# Patient Record
Sex: Female | Born: 1935 | Race: White | Hispanic: No | Marital: Married | Smoking: Former smoker
Health system: Southern US, Community
[De-identification: ages and names within clinical notes are randomized; demographics above are authoritative.]

## PROBLEM LIST (undated history)

## (undated) DIAGNOSIS — A310 Pulmonary mycobacterial infection: Secondary | ICD-10-CM

## (undated) DIAGNOSIS — J449 Chronic obstructive pulmonary disease, unspecified: Secondary | ICD-10-CM

## (undated) DIAGNOSIS — J181 Lobar pneumonia, unspecified organism: Secondary | ICD-10-CM

## (undated) DIAGNOSIS — J9621 Acute and chronic respiratory failure with hypoxia: Secondary | ICD-10-CM

---

## 2017-04-14 ENCOUNTER — Inpatient Hospital Stay
Admission: RE | Admit: 2017-04-14 | Discharge: 2017-06-09 | Disposition: E | Payer: Medicare PPO | Source: Other Acute Inpatient Hospital | Attending: Internal Medicine | Admitting: Internal Medicine

## 2017-04-14 ENCOUNTER — Ambulatory Visit (HOSPITAL_COMMUNITY)
Admission: AD | Admit: 2017-04-14 | Discharge: 2017-04-14 | Disposition: A | Discharge status: Home / Self Care | Payer: Medicare PPO | Source: Other Acute Inpatient Hospital | Attending: Internal Medicine | Admitting: Internal Medicine

## 2017-04-14 ENCOUNTER — Other Ambulatory Visit (HOSPITAL_COMMUNITY): Payer: Self-pay

## 2017-04-14 DIAGNOSIS — Z0189 Encounter for other specified special examinations: Secondary | ICD-10-CM

## 2017-04-14 DIAGNOSIS — J939 Pneumothorax, unspecified: Secondary | ICD-10-CM | POA: Diagnosis not present

## 2017-04-14 DIAGNOSIS — Z931 Gastrostomy status: Secondary | ICD-10-CM

## 2017-04-14 DIAGNOSIS — J9621 Acute and chronic respiratory failure with hypoxia: Secondary | ICD-10-CM | POA: Diagnosis present

## 2017-04-14 DIAGNOSIS — R079 Chest pain, unspecified: Secondary | ICD-10-CM

## 2017-04-14 DIAGNOSIS — R911 Solitary pulmonary nodule: Secondary | ICD-10-CM | POA: Diagnosis not present

## 2017-04-14 DIAGNOSIS — Z0489 Encounter for examination and observation for other specified reasons: Secondary | ICD-10-CM | POA: Diagnosis present

## 2017-04-14 DIAGNOSIS — J181 Lobar pneumonia, unspecified organism: Secondary | ICD-10-CM | POA: Diagnosis present

## 2017-04-14 DIAGNOSIS — A310 Pulmonary mycobacterial infection: Secondary | ICD-10-CM | POA: Diagnosis present

## 2017-04-14 DIAGNOSIS — T85598A Other mechanical complication of other gastrointestinal prosthetic devices, implants and grafts, initial encounter: Secondary | ICD-10-CM

## 2017-04-14 DIAGNOSIS — J449 Chronic obstructive pulmonary disease, unspecified: Secondary | ICD-10-CM | POA: Diagnosis present

## 2017-04-14 DIAGNOSIS — K668 Other specified disorders of peritoneum: Secondary | ICD-10-CM

## 2017-04-14 DIAGNOSIS — J9690 Respiratory failure, unspecified, unspecified whether with hypoxia or hypercapnia: Secondary | ICD-10-CM

## 2017-04-14 HISTORY — DX: Chronic obstructive pulmonary disease, unspecified: J44.9

## 2017-04-14 HISTORY — DX: Lobar pneumonia, unspecified organism: J18.1

## 2017-04-14 HISTORY — DX: Pulmonary mycobacterial infection: A31.0

## 2017-04-14 HISTORY — DX: Acute and chronic respiratory failure with hypoxia: J96.21

## 2017-04-15 LAB — URINALYSIS, ROUTINE W REFLEX MICROSCOPIC
Bacteria, UA: NONE SEEN
Bilirubin Urine: NEGATIVE
GLUCOSE, UA: NEGATIVE mg/dL
HGB URINE DIPSTICK: NEGATIVE
Ketones, ur: NEGATIVE mg/dL
LEUKOCYTES UA: NEGATIVE
NITRITE: NEGATIVE
PROTEIN: 30 mg/dL — AB
SPECIFIC GRAVITY, URINE: 1.024 (ref 1.005–1.030)
pH: 9 — ABNORMAL HIGH (ref 5.0–8.0)

## 2017-04-15 LAB — COMPREHENSIVE METABOLIC PANEL
ALBUMIN: 2.2 g/dL — AB (ref 3.5–5.0)
ALK PHOS: 41 U/L (ref 38–126)
ALT: 19 U/L (ref 14–54)
AST: 29 U/L (ref 15–41)
Anion gap: 14 (ref 5–15)
BILIRUBIN TOTAL: 0.6 mg/dL (ref 0.3–1.2)
BUN: 9 mg/dL (ref 6–20)
CALCIUM: 8.7 mg/dL — AB (ref 8.9–10.3)
CO2: 40 mmol/L — ABNORMAL HIGH (ref 22–32)
CREATININE: 0.89 mg/dL (ref 0.44–1.00)
Chloride: 86 mmol/L — ABNORMAL LOW (ref 101–111)
GFR, EST NON AFRICAN AMERICAN: 59 mL/min — AB (ref >60)
Glucose, Bld: 79 mg/dL (ref 65–99)
Potassium: 3.7 mmol/L (ref 3.5–5.1)
Sodium: 140 mmol/L (ref 135–145)
TOTAL PROTEIN: 6 g/dL — AB (ref 6.5–8.1)

## 2017-04-15 LAB — CBC
HCT: 35.2 % — ABNORMAL LOW (ref 36.0–46.0)
Hemoglobin: 10.4 g/dL — ABNORMAL LOW (ref 12.0–15.0)
MCH: 26.3 pg (ref 26.0–34.0)
MCHC: 29.5 g/dL — ABNORMAL LOW (ref 30.0–36.0)
MCV: 89.1 fL (ref 78.0–100.0)
PLATELETS: 119 10*3/uL — AB (ref 150–400)
RBC: 3.95 MIL/uL (ref 3.87–5.11)
RDW: 19 % — AB (ref 11.5–15.5)
WBC: 8 10*3/uL (ref 4.0–10.5)

## 2017-04-16 LAB — URINE CULTURE
Culture: <10000 — AB
Special Requests: NORMAL

## 2017-04-18 ENCOUNTER — Other Ambulatory Visit (HOSPITAL_COMMUNITY): Payer: Self-pay

## 2017-04-21 ENCOUNTER — Other Ambulatory Visit (HOSPITAL_COMMUNITY): Payer: Self-pay

## 2017-04-21 LAB — CBC
HEMATOCRIT: 37.7 % (ref 36.0–46.0)
HEMOGLOBIN: 10.5 g/dL — AB (ref 12.0–15.0)
MCH: 26.8 pg (ref 26.0–34.0)
MCHC: 27.9 g/dL — AB (ref 30.0–36.0)
MCV: 96.2 fL (ref 78.0–100.0)
Platelets: 137 10*3/uL — ABNORMAL LOW (ref 150–400)
RBC: 3.92 MIL/uL (ref 3.87–5.11)
RDW: 17.4 % — AB (ref 11.5–15.5)
WBC: 8.2 10*3/uL (ref 4.0–10.5)

## 2017-04-21 LAB — BASIC METABOLIC PANEL
Anion gap: 15 (ref 5–15)
BUN: 7 mg/dL (ref 6–20)
CHLORIDE: 81 mmol/L — AB (ref 101–111)
CO2: 41 mmol/L — AB (ref 22–32)
CREATININE: 0.56 mg/dL (ref 0.44–1.00)
Calcium: 9.2 mg/dL (ref 8.9–10.3)
GFR calc Af Amer: >60 mL/min (ref >60)
GFR calc non Af Amer: >60 mL/min (ref >60)
Glucose, Bld: 156 mg/dL — ABNORMAL HIGH (ref 65–99)
Potassium: 4 mmol/L (ref 3.5–5.1)
Sodium: 137 mmol/L (ref 135–145)

## 2017-04-21 LAB — BLOOD GAS, ARTERIAL
ACID-BASE EXCESS: 20 mmol/L — AB (ref 0.0–2.0)
Acid-Base Excess: 24 mmol/L — ABNORMAL HIGH (ref 0.0–2.0)
Bicarbonate: 48.9 mmol/L — ABNORMAL HIGH (ref 20.0–28.0)
Bicarbonate: 50.9 mmol/L — ABNORMAL HIGH (ref 20.0–28.0)
FIO2: 70
FIO2: 70
MECHVT: 300 mL
O2 SAT: 93 %
O2 Saturation: 92.3 %
PATIENT TEMPERATURE: 98.6
PCO2 ART: 93.2 mmHg — AB (ref 32.0–48.0)
PEEP/CPAP: 5 cmH2O
PEEP: 5 cmH2O
PH ART: 7.171 — AB (ref 7.350–7.450)
PO2 ART: 75.9 mmHg — AB (ref 83.0–108.0)
PO2 ART: 59.5 mmHg — AB (ref 83.0–108.0)
Patient temperature: 98.6
RATE: 18 resp/min
RATE: 18 resp/min
VT: 300 mL
pH, Arterial: 7.357 (ref 7.350–7.450)

## 2017-04-21 LAB — D-DIMER, QUANTITATIVE: D-Dimer, Quant: 3.13 ug/mL-FEU — ABNORMAL HIGH (ref 0.00–0.50)

## 2017-04-21 LAB — TROPONIN I: Troponin I: <0.03 ng/mL (ref <0.03)

## 2017-04-21 MED FILL — Medication: Qty: 1 | Status: AC

## 2017-04-22 LAB — BLOOD GAS, ARTERIAL
Acid-Base Excess: 26.1 mmol/L — ABNORMAL HIGH (ref 0.0–2.0)
Bicarbonate: 52.7 mmol/L — ABNORMAL HIGH (ref 20.0–28.0)
FIO2: 70
O2 SAT: 99.1 %
PEEP: 5 cmH2O
Patient temperature: 98.9
RATE: 18 resp/min
VT: 300 mL
pCO2 arterial: 82.1 mmHg (ref 32.0–48.0)
pH, Arterial: 7.424 (ref 7.350–7.450)
pO2, Arterial: 157 mmHg — ABNORMAL HIGH (ref 83.0–108.0)

## 2017-04-22 LAB — BASIC METABOLIC PANEL
Anion gap: 15 (ref 5–15)
BUN: 10 mg/dL (ref 6–20)
CHLORIDE: 81 mmol/L — AB (ref 101–111)
CO2: 44 mmol/L — AB (ref 22–32)
CREATININE: 0.72 mg/dL (ref 0.44–1.00)
Calcium: 9.1 mg/dL (ref 8.9–10.3)
GFR calc Af Amer: >60 mL/min (ref >60)
GFR calc non Af Amer: >60 mL/min (ref >60)
Glucose, Bld: 131 mg/dL — ABNORMAL HIGH (ref 65–99)
POTASSIUM: 4.4 mmol/L (ref 3.5–5.1)
Sodium: 140 mmol/L (ref 135–145)

## 2017-04-22 LAB — MAGNESIUM: Magnesium: 1.6 mg/dL — ABNORMAL LOW (ref 1.7–2.4)

## 2017-04-23 LAB — BLOOD GAS, ARTERIAL
ACID-BASE EXCESS: 28.5 mmol/L — AB (ref 0.0–2.0)
Acid-Base Excess: 28.5 mmol/L — ABNORMAL HIGH (ref 0.0–2.0)
BICARBONATE: 54.8 mmol/L — AB (ref 20.0–28.0)
Bicarbonate: 55.4 mmol/L — ABNORMAL HIGH (ref 20.0–28.0)
FIO2: 0.45
FIO2: 50
MECHVT: 300 mL
O2 SAT: 94.9 %
O2 SAT: 95.1 %
PATIENT TEMPERATURE: 99.4
PCO2 ART: 89.4 mmHg — AB (ref 32.0–48.0)
PEEP/CPAP: 5 cmH2O
PEEP: 5 cmH2O
PH ART: 7.5 — AB (ref 7.350–7.450)
PO2 ART: 78.9 mmHg — AB (ref 83.0–108.0)
Patient temperature: 98.5
RATE: 18 resp/min
RATE: 18 resp/min
VT: 350 mL
pCO2 arterial: 70.8 mmHg (ref 32.0–48.0)
pH, Arterial: 7.411 (ref 7.350–7.450)
pO2, Arterial: 71 mmHg — ABNORMAL LOW (ref 83.0–108.0)

## 2017-04-23 LAB — BASIC METABOLIC PANEL
Anion gap: 13 (ref 5–15)
BUN: 14 mg/dL (ref 6–20)
CHLORIDE: 78 mmol/L — AB (ref 101–111)
CO2: 47 mmol/L — AB (ref 22–32)
CREATININE: 0.69 mg/dL (ref 0.44–1.00)
Calcium: 9.1 mg/dL (ref 8.9–10.3)
GFR calc Af Amer: >60 mL/min (ref >60)
GFR calc non Af Amer: >60 mL/min (ref >60)
Glucose, Bld: 112 mg/dL — ABNORMAL HIGH (ref 65–99)
POTASSIUM: 3.5 mmol/L (ref 3.5–5.1)
SODIUM: 138 mmol/L (ref 135–145)

## 2017-04-23 LAB — CBC
HEMATOCRIT: 31.1 % — AB (ref 36.0–46.0)
HEMOGLOBIN: 9 g/dL — AB (ref 12.0–15.0)
MCH: 27.1 pg (ref 26.0–34.0)
MCHC: 28.9 g/dL — AB (ref 30.0–36.0)
MCV: 93.7 fL (ref 78.0–100.0)
Platelets: 189 10*3/uL (ref 150–400)
RBC: 3.32 MIL/uL — AB (ref 3.87–5.11)
RDW: 19.3 % — ABNORMAL HIGH (ref 11.5–15.5)
WBC: 7.6 10*3/uL (ref 4.0–10.5)

## 2017-04-23 LAB — URINALYSIS, ROUTINE W REFLEX MICROSCOPIC
BILIRUBIN URINE: NEGATIVE
Glucose, UA: NEGATIVE mg/dL
Ketones, ur: NEGATIVE mg/dL
NITRITE: NEGATIVE
PH: 7 (ref 5.0–8.0)
Protein, ur: 100 mg/dL — AB
SPECIFIC GRAVITY, URINE: 1.027 (ref 1.005–1.030)
Squamous Epithelial / LPF: NONE SEEN

## 2017-04-24 LAB — URINE CULTURE

## 2017-04-27 LAB — CBC
HCT: 34.3 % — ABNORMAL LOW (ref 36.0–46.0)
Hemoglobin: 10.5 g/dL — ABNORMAL LOW (ref 12.0–15.0)
MCH: 27.6 pg (ref 26.0–34.0)
MCHC: 30.6 g/dL (ref 30.0–36.0)
MCV: 90.3 fL (ref 78.0–100.0)
PLATELETS: 202 10*3/uL (ref 150–400)
RBC: 3.8 MIL/uL — AB (ref 3.87–5.11)
RDW: 18.8 % — ABNORMAL HIGH (ref 11.5–15.5)
WBC: 15 10*3/uL — AB (ref 4.0–10.5)

## 2017-04-28 LAB — C DIFFICILE QUICK SCREEN W PCR REFLEX
C DIFFICILE (CDIFF) TOXIN: NEGATIVE
C DIFFICLE (CDIFF) ANTIGEN: NEGATIVE
C Diff interpretation: NOT DETECTED

## 2017-04-30 LAB — CBC
HEMATOCRIT: 28.8 % — AB (ref 36.0–46.0)
HEMOGLOBIN: 8.5 g/dL — AB (ref 12.0–15.0)
MCH: 27.5 pg (ref 26.0–34.0)
MCHC: 29.5 g/dL — ABNORMAL LOW (ref 30.0–36.0)
MCV: 93.2 fL (ref 78.0–100.0)
Platelets: 257 10*3/uL (ref 150–400)
RBC: 3.09 MIL/uL — ABNORMAL LOW (ref 3.87–5.11)
RDW: 19.5 % — ABNORMAL HIGH (ref 11.5–15.5)
WBC: 12.8 10*3/uL — ABNORMAL HIGH (ref 4.0–10.5)

## 2017-04-30 LAB — BASIC METABOLIC PANEL
ANION GAP: 15 (ref 5–15)
BUN: 18 mg/dL (ref 6–20)
CO2: 43 mmol/L — AB (ref 22–32)
Calcium: 8.6 mg/dL — ABNORMAL LOW (ref 8.9–10.3)
Chloride: 80 mmol/L — ABNORMAL LOW (ref 101–111)
Creatinine, Ser: 0.77 mg/dL (ref 0.44–1.00)
GFR calc non Af Amer: >60 mL/min (ref >60)
Glucose, Bld: 126 mg/dL — ABNORMAL HIGH (ref 65–99)
POTASSIUM: 2.9 mmol/L — AB (ref 3.5–5.1)
Sodium: 138 mmol/L (ref 135–145)

## 2017-05-01 LAB — POTASSIUM: Potassium: 3.2 mmol/L — ABNORMAL LOW (ref 3.5–5.1)

## 2017-05-02 ENCOUNTER — Encounter: Admission: RE | Disposition: E | Payer: Self-pay | Source: Other Acute Inpatient Hospital | Attending: Internal Medicine

## 2017-05-02 ENCOUNTER — Encounter (HOSPITAL_COMMUNITY): Payer: Self-pay | Admitting: Certified Registered"

## 2017-05-02 HISTORY — PX: TRACHEOSTOMY TUBE PLACEMENT: SHX814

## 2017-05-02 LAB — STOOL CULTURE REFLEX - RSASHR

## 2017-05-02 LAB — STOOL CULTURE REFLEX - CMPCXR

## 2017-05-02 LAB — STOOL CULTURE: E COLI SHIGA TOXIN ASSAY: NEGATIVE

## 2017-05-02 SURGERY — CREATION, TRACHEOSTOMY
Anesthesia: General | Site: Neck

## 2017-05-02 MED ORDER — PHENYLEPHRINE 40 MCG/ML (10ML) SYRINGE FOR IV PUSH (FOR BLOOD PRESSURE SUPPORT)
PREFILLED_SYRINGE | INTRAVENOUS | Status: DC | PRN
  Administered 2017-05-02 (×3): 80 ug via INTRAVENOUS

## 2017-05-02 MED ORDER — PROPOFOL 10 MG/ML IV BOLUS
INTRAVENOUS | Status: DC | PRN
  Administered 2017-05-02: 50 mg via INTRAVENOUS

## 2017-05-02 MED ORDER — EPHEDRINE SULFATE-NACL 50-0.9 MG/10ML-% IV SOSY
PREFILLED_SYRINGE | INTRAVENOUS | Status: DC | PRN
  Administered 2017-05-02: 10 mg via INTRAVENOUS
  Administered 2017-05-02: 15 mg via INTRAVENOUS

## 2017-05-02 MED ORDER — ROCURONIUM BROMIDE 10 MG/ML (PF) SYRINGE
PREFILLED_SYRINGE | INTRAVENOUS | Status: AC
  Filled 2017-05-02: qty 5

## 2017-05-02 MED ORDER — ROCURONIUM BROMIDE 10 MG/ML (PF) SYRINGE
PREFILLED_SYRINGE | INTRAVENOUS | Status: DC | PRN
  Administered 2017-05-02: 40 mg via INTRAVENOUS
  Administered 2017-05-02: 10 mg via INTRAVENOUS

## 2017-05-02 MED ORDER — DEXAMETHASONE SODIUM PHOSPHATE 10 MG/ML IJ SOLN
INTRAMUSCULAR | Status: AC
  Filled 2017-05-02: qty 1

## 2017-05-02 MED ORDER — ONDANSETRON HCL 4 MG/2ML IJ SOLN
INTRAMUSCULAR | Status: AC
  Filled 2017-05-02: qty 2

## 2017-05-02 MED ORDER — LIDOCAINE 2% (20 MG/ML) 5 ML SYRINGE
INTRAMUSCULAR | Status: DC | PRN
  Administered 2017-05-02: 40 mg via INTRAVENOUS

## 2017-05-02 MED ORDER — LIDOCAINE 2% (20 MG/ML) 5 ML SYRINGE
INTRAMUSCULAR | Status: AC
  Filled 2017-05-02: qty 5

## 2017-05-02 MED ORDER — PROPOFOL 10 MG/ML IV BOLUS
INTRAVENOUS | Status: AC
  Filled 2017-05-02: qty 20

## 2017-05-02 MED ORDER — LACTATED RINGERS IV SOLN
INTRAVENOUS | Status: DC | PRN
  Administered 2017-05-02: 08:00:00 via INTRAVENOUS

## 2017-05-02 MED ORDER — DEXAMETHASONE SODIUM PHOSPHATE 10 MG/ML IJ SOLN
INTRAMUSCULAR | Status: DC | PRN
  Administered 2017-05-02: 5 mg via INTRAVENOUS

## 2017-05-02 MED ORDER — ONDANSETRON HCL 4 MG/2ML IJ SOLN
INTRAMUSCULAR | Status: DC | PRN
Start: 2017-05-02 — End: 2017-05-02
  Administered 2017-05-02: 4 mg via INTRAVENOUS

## 2017-05-02 MED ORDER — LIDOCAINE-EPINEPHRINE 1 %-1:100000 IJ SOLN
INTRAMUSCULAR | Status: DC | PRN
  Administered 2017-05-02: 6 mL

## 2017-05-02 MED ORDER — FENTANYL CITRATE (PF) 100 MCG/2ML IJ SOLN
INTRAMUSCULAR | Status: DC | PRN
  Administered 2017-05-02: 50 ug via INTRAVENOUS

## 2017-05-02 MED ORDER — 0.9 % SODIUM CHLORIDE (POUR BTL) OPTIME
TOPICAL | Status: DC | PRN
  Administered 2017-05-02: 1000 mL

## 2017-05-02 MED ORDER — STERILE WATER FOR IRRIGATION IR SOLN
Status: DC | PRN
  Administered 2017-05-02: 1

## 2017-05-02 MED ORDER — FENTANYL CITRATE (PF) 250 MCG/5ML IJ SOLN
INTRAMUSCULAR | Status: AC
  Filled 2017-05-02: qty 5

## 2017-05-02 SURGICAL SUPPLY — 45 items
ATTRACTOMAT 16X20 MAGNETIC DRP (DRAPES) IMPLANT
BLADE SURG 15 STRL LF DISP TIS (BLADE) ×1 IMPLANT
BLADE SURG 15 STRL SS (BLADE) ×2
CLEANER TIP ELECTROSURG 2X2 (MISCELLANEOUS) ×3 IMPLANT
COVER SURGICAL LIGHT HANDLE (MISCELLANEOUS) ×3 IMPLANT
DRAPE HALF SHEET 40X57 (DRAPES) ×3 IMPLANT
ELECT COATED BLADE 2.86 ST (ELECTRODE) ×3 IMPLANT
ELECT REM PT RETURN 9FT ADLT (ELECTROSURGICAL) ×3
ELECTRODE REM PT RTRN 9FT ADLT (ELECTROSURGICAL) ×1 IMPLANT
GAUZE SPONGE 4X4 16PLY XRAY LF (GAUZE/BANDAGES/DRESSINGS) ×3 IMPLANT
GEL ULTRASOUND 20GR AQUASONIC (MISCELLANEOUS) ×3 IMPLANT
GLOVE BIOGEL PI IND STRL 7.0 (GLOVE) ×1 IMPLANT
GLOVE BIOGEL PI IND STRL 8.5 (GLOVE) ×1 IMPLANT
GLOVE BIOGEL PI INDICATOR 7.0 (GLOVE) ×2
GLOVE BIOGEL PI INDICATOR 8.5 (GLOVE) ×2
GLOVE SS BIOGEL STRL SZ 7.5 (GLOVE) ×1 IMPLANT
GLOVE SUPERSENSE BIOGEL SZ 7.5 (GLOVE) ×2
GLOVE SURG SS PI 6.5 STRL IVOR (GLOVE) ×3 IMPLANT
GLOVE SURG SS PI 7.0 STRL IVOR (GLOVE) ×3 IMPLANT
GLOVE SURG SS PI 8.0 STRL IVOR (GLOVE) ×3 IMPLANT
GOWN STRL REUS W/ TWL LRG LVL3 (GOWN DISPOSABLE) ×1 IMPLANT
GOWN STRL REUS W/ TWL XL LVL3 (GOWN DISPOSABLE) ×1 IMPLANT
GOWN STRL REUS W/TWL LRG LVL3 (GOWN DISPOSABLE) ×2
GOWN STRL REUS W/TWL XL LVL3 (GOWN DISPOSABLE) ×2
HOLDER TRACH TUBE VELCRO 19.5 (MISCELLANEOUS) ×3 IMPLANT
KIT BASIN OR (CUSTOM PROCEDURE TRAY) ×3 IMPLANT
KIT ROOM TURNOVER OR (KITS) ×3 IMPLANT
KIT SUCTION CATH 14FR (SUCTIONS) ×3 IMPLANT
NEEDLE HYPO 25GX1X1/2 BEV (NEEDLE) ×3 IMPLANT
NS IRRIG 1000ML POUR BTL (IV SOLUTION) ×3 IMPLANT
PACK EENT II TURBAN DRAPE (CUSTOM PROCEDURE TRAY) ×3 IMPLANT
PAD ARMBOARD 7.5X6 YLW CONV (MISCELLANEOUS) ×3 IMPLANT
PENCIL BUTTON HOLSTER BLD 10FT (ELECTRODE) ×3 IMPLANT
SPONGE DRAIN TRACH 4X4 STRL 2S (GAUZE/BANDAGES/DRESSINGS) ×3 IMPLANT
SPONGE INTESTINAL PEANUT (DISPOSABLE) ×3 IMPLANT
SUT SILK 2 0 SH CR/8 (SUTURE) ×3 IMPLANT
SUT SILK 3 0 TIES 10X30 (SUTURE) IMPLANT
SYR 5ML LUER SLIP (SYRINGE) ×3 IMPLANT
SYR CONTROL 10ML LL (SYRINGE) ×3 IMPLANT
TOWEL OR 17X24 6PK STRL BLUE (TOWEL DISPOSABLE) ×3 IMPLANT
TOWEL OR 17X26 10 PK STRL BLUE (TOWEL DISPOSABLE) ×3 IMPLANT
TUBE CONNECTING 12'X1/4 (SUCTIONS) ×1
TUBE CONNECTING 12X1/4 (SUCTIONS) ×2 IMPLANT
TUBE TRACH SHILEY  6 DIST  CUF (TUBING) ×3 IMPLANT
TUBE TRACH SHILEY 8 DIST CUF (TUBING) IMPLANT

## 2017-05-02 NOTE — Brief Op Note (Signed)
04/18/2017  8:37 AM  PATIENT:  Sharon Solomon  84 y.o. female  PRE-OPERATIVE DIAGNOSIS:  ACUTE RESPRITORY FAILURE  POST-OPERATIVE DIAGNOSIS:  ACUTE RESPRITORY FAILURE  PROCEDURE:  Procedure(s): TRACHEOSTOMY (N/A)  #6 Shiley cuffed  SURGEON:  Surgeon(s) and Role:    Drema Halon* Karissa Meenan E, MD - Primary  PHYSICIAN ASSISTANT:   ASSISTANTS: none   ANESTHESIA:   general  EBL:  5 mL   BLOOD ADMINISTERED:none  DRAINS: none   LOCAL MEDICATIONS USED:  XYLOCAINE with epi 6 cc  SPECIMEN:  No Specimen  DISPOSITION OF SPECIMEN:  N/A  COUNTS:  YES  TOURNIQUET:  * No tourniquets in log *  DICTATION: .Other Dictation: Dictation Number [id number]  PLAN OF CARE: Discharge to home after PACU  PATIENT DISPOSITION:  PACU - hemodynamically stable.   Delay start of Pharmacological VTE agent (>24hrs) due to surgical blood loss or risk of bleeding: yes

## 2017-05-02 NOTE — H&P (Signed)
PREOPERATIVE H&P  Chief Complaint: acute on chronic respiratory failure  HPI: Sharon Solomon is a 84 y.o. female who presents for evaluation of chronic respiratory failure. Patient with history of pulmonary fibrosis with subsequent respiratory problems. [facility] on 2[facility]. She's been intubated for over 2 weeks with failed attempts to wean off vent and trach was recommended.  No past medical history on file.  Social History   Socioeconomic History  . Marital status: Married    Spouse name: Not on file  . Number of children: Not on file  . Years of education: Not on file  . Highest education level: Not on file  Social Needs  . Financial resource strain: Not on file  . Food insecurity - worry: Not on file  . Food insecurity - inability: Not on file  . Transportation needs - medical: Not on file  . Transportation needs - non-medical: Not on file  Occupational History  . Not on file  Tobacco Use  . Smoking status: Not on file  Substance and Sexual Activity  . Alcohol use: Not on file  . Drug use: Not on file  . Sexual activity: Not on file  Other Topics Concern  . Not on file  Social History Narrative  . Not on file   No family history on file. Allergies not on file Prior to Admission medications   Not on File     Positive ROS: discussed trach with patient and son  All other systems have been reviewed and were otherwise negative with the exception of those mentioned in the HPI and as above.  Physical Exam: There were no vitals filed for this visit.  General: Alert, no acute distress, intubated Nasal: Clear nasal passages, NG tube Neck: No palpable adenopathy or thyroid nodules, trach midline Cardiovascular: Regular rate and rhythm, no murmur.  Respiratory: Clear to auscultation Neurologic: Alert and oriented x 3   Assessment/Plan: ACUTE RESPRITORY FAILURE Plan for Procedure(s): TRACHEOSTOMY   Dillard Cannonhristopher Newman, MD 04/24/2017 7:34 AM

## 2017-05-02 NOTE — Interval H&P Note (Signed)
History and Physical Interval Note:  05/01/2017 7:42 AM  Sharon Solomon  has presented today for surgery, with the diagnosis of ACUTE RESPRITORY FAILURE  The various methods of treatment have been discussed with the patient and family. After consideration of risks, benefits and other options for treatment, the patient has consented to  Procedure(s): TRACHEOSTOMY (N/A) as a surgical intervention .  The patient's history has been reviewed, patient examined, no change in status, stable for surgery.  I have reviewed the patient's chart and labs.  Questions were answered to the patient's satisfaction.     Dillard Cannonhristopher Newman

## 2017-05-02 NOTE — Transfer of Care (Signed)
Immediate Anesthesia Transfer of Care Note  Patient: Sharon Solomon  Procedure(s) Performed: TRACHEOSTOMY (N/A Neck)  Patient Location: [facility] [phone]  Anesthesia Type:General  Level of Consciousness: Patient remains intubated per anesthesia plan  Airway & Oxygen Therapy: Patient remains intubated per anesthesia plan and Patient placed on Ventilator (see vital sign flow sheet for setting)  Post-op Assessment: Report given to RN  Post vital signs: Reviewed and stable  Last Vitals: There were no vitals filed for this visit.  Last Pain: There were no vitals filed for this visit.       Complications: No apparent anesthesia complications

## 2017-05-02 NOTE — Anesthesia Preprocedure Evaluation (Addendum)
Anesthesia Evaluation  Patient identified by MRN, date of birth, ID band Patient unresponsive    Reviewed: Unable to perform ROS - Chart review only  History of Anesthesia Complications Negative for: history of anesthetic complications  Airway Mallampati: Intubated       Dental  (+) Dental Advisory Given   Pulmonary       rales    Cardiovascular  Rhythm:Regular     Neuro/Psych    GI/Hepatic   Endo/Other    Renal/GU      Musculoskeletal   Abdominal   Peds  Hematology   Anesthesia Other Findings   Reproductive/Obstetrics                            Anesthesia Physical Anesthesia Plan  ASA: III  Anesthesia Plan: General   Post-op Pain Management:    Induction: Inhalational  PONV Risk Score and Plan: 3 and Treatment may vary due to age or medical condition  Airway Management Planned: Oral ETT and Tracheostomy  Additional Equipment:   Intra-op Plan:   Post-operative Plan: Post-operative intubation/ventilation  Informed Consent: I have reviewed the patients History and Physical, chart, labs and discussed the procedure including the risks, benefits and alternatives for the proposed anesthesia with the patient or authorized representative who has indicated his/her understanding and acceptance.     Plan Discussed with: CRNA and Surgeon  Anesthesia Plan Comments:         Anesthesia Quick Evaluation

## 2017-05-03 ENCOUNTER — Other Ambulatory Visit (HOSPITAL_COMMUNITY): Payer: Self-pay

## 2017-05-04 ENCOUNTER — Other Ambulatory Visit (HOSPITAL_COMMUNITY): Payer: Self-pay

## 2017-05-04 NOTE — Progress Notes (Signed)
IR aware of request for percutaneous gastrostomy tube placement.   CT Abdomen obtained and shows: The left lobe of the liver as well as dilated bowel loops are positioned anterior to the stomach. Gastrostomy tube patient is not possible by radiology.  [facility] to notify medical team.   Will cancel order as no procedure is planned at this time.   Also notified of additional CT findings.  RN states a copy of the scan has been sent to [facility] for review.   Loyce DysKacie Matthews, MS RD PA-C 1:42 PM

## 2017-05-05 ENCOUNTER — Encounter (HOSPITAL_COMMUNITY): Payer: Self-pay | Admitting: Otolaryngology

## 2017-05-05 NOTE — Op Note (Signed)
NAME:  Sharon EdisDALLAS, Sharon Solomon                     ACCOUNT NO.:  MEDICAL RECORD NO.:  [id number]  LOCATION:                                 FACILITY:  PHYSICIAN:  Kristine GarbeChristopher E. Ezzard StandingNewman, M.D. DATE OF BIRTH:  DATE OF PROCEDURE:  04/25/2017 DATE OF DISCHARGE:                              OPERATIVE REPORT   PREOPERATIVE DIAGNOSIS:  Acute on chronic respiratory failure.  POSTOPERATIVE DIAGNOSIS:  Acute on chronic respiratory failure.  OPERATION PERFORMED:  Tracheotomy with a #6 Shiley cuffed tube.  SURGEON:  Kristine GarbeChristopher E. Ezzard StandingNewman, MD.  ANESTHESIA:  General endotracheal.  COMPLICATIONS:  None.  ESTIMATED BLOOD LOSS:  Minimal.  BRIEF CLINICAL NOTE:  Sharon EdisCecil Solomon is an 84 year old female who has had a history of pulmonary fibrosis.  She subsequently developed acute respiratory failure requiring intubation.  [facility] a little over 2 weeks ago.  Attempts at weaning the patient off band were unsuccessful and a tracheotomy was recommended. She was taken to the operating room at this time for tracheotomy.  DESCRIPTION OF PROCEDURE:  The patient was brought straight to the operating room from [facility] on the vent.  She remained in her bed.  Her neck was extended with a roll beneath the shoulders. The trach was palpable midline.  No masses identified.  The area was marked out and injected with 5 mL of Xylocaine with epinephrine for hemostasis.  A vertical incision was made just above the suprasternal notch just below the cricoid cartilage.  Dissection was carried down through subcutaneous tissue.  The trachea was identified.  A hook was placed underneath the cricoid cartilage and a horizontal tracheotomy was performed with a scalpel.  Vertical incisions were made inferiorly on either side.  Endotracheal tube was removed and a #6 cuffed Shiley tube was inserted without any difficulty.  The patient was ventilated well. The trach was secured  with 2-0 silk sutures x4 and Velcro trach collar around the neck.  The patient was subsequently [facility].          ______________________________ Kristine Garbehristopher E. Ezzard StandingNewman, M.D.    CEN/MEDQ  D:  04/26/2017  T:  05/03/2017  Job:  [id number]

## 2017-05-05 NOTE — Anesthesia Postprocedure Evaluation (Signed)
Anesthesia Post Note  Patient: Sharon Solomon  Procedure(s) Performed: TRACHEOSTOMY (N/A Neck)     Patient location during evaluation: Other Anesthesia Type: General Level of consciousness: sedated Pain management: pain level controlled Vital Signs Assessment: post-procedure vital signs reviewed and stable Respiratory status: respiratory function stable and patient connected to tracheostomy mask oxygen Cardiovascular status: stable Postop Assessment: no apparent nausea or vomiting Anesthetic complications: no    Last Vitals: There were no vitals filed for this visit.  Last Pain: There were no vitals filed for this visit.               Roshonda Sperl

## 2017-05-07 LAB — BASIC METABOLIC PANEL
ANION GAP: 14 (ref 5–15)
BUN: 14 mg/dL (ref 6–20)
CHLORIDE: 77 mmol/L — AB (ref 101–111)
CO2: 43 mmol/L — ABNORMAL HIGH (ref 22–32)
Calcium: 9.1 mg/dL (ref 8.9–10.3)
Creatinine, Ser: 0.61 mg/dL (ref 0.44–1.00)
GFR calc Af Amer: >60 mL/min (ref >60)
Glucose, Bld: 84 mg/dL (ref 65–99)
POTASSIUM: 3.2 mmol/L — AB (ref 3.5–5.1)
SODIUM: 134 mmol/L — AB (ref 135–145)

## 2017-05-07 LAB — CBC
HEMATOCRIT: 32.8 % — AB (ref 36.0–46.0)
HEMOGLOBIN: 9.7 g/dL — AB (ref 12.0–15.0)
MCH: 27.3 pg (ref 26.0–34.0)
MCHC: 29.6 g/dL — ABNORMAL LOW (ref 30.0–36.0)
MCV: 92.4 fL (ref 78.0–100.0)
Platelets: 403 10*3/uL — ABNORMAL HIGH (ref 150–400)
RBC: 3.55 MIL/uL — AB (ref 3.87–5.11)
RDW: 17.9 % — ABNORMAL HIGH (ref 11.5–15.5)
WBC: 8.9 10*3/uL (ref 4.0–10.5)

## 2017-05-09 LAB — C DIFFICILE QUICK SCREEN W PCR REFLEX
C Diff antigen: POSITIVE — AB
C Diff interpretation: DETECTED
C Diff toxin: POSITIVE — AB

## 2017-05-09 DEATH — deceased

## 2017-05-11 LAB — CBC
HEMATOCRIT: 35.6 % — AB (ref 36.0–46.0)
Hemoglobin: 10 g/dL — ABNORMAL LOW (ref 12.0–15.0)
MCH: 27.6 pg (ref 26.0–34.0)
MCHC: 28.1 g/dL — AB (ref 30.0–36.0)
MCV: 98.3 fL (ref 78.0–100.0)
PLATELETS: 264 10*3/uL (ref 150–400)
RBC: 3.62 MIL/uL — ABNORMAL LOW (ref 3.87–5.11)
RDW: 16.4 % — AB (ref 11.5–15.5)
WBC: 12.1 10*3/uL — ABNORMAL HIGH (ref 4.0–10.5)

## 2017-05-11 LAB — BASIC METABOLIC PANEL
ANION GAP: 15 (ref 5–15)
BUN: 19 mg/dL (ref 6–20)
CHLORIDE: 68 mmol/L — AB (ref 101–111)
CO2: 49 mmol/L — AB (ref 22–32)
Calcium: 9.2 mg/dL (ref 8.9–10.3)
Creatinine, Ser: 0.59 mg/dL (ref 0.44–1.00)
GFR calc non Af Amer: >60 mL/min (ref >60)
GLUCOSE: 112 mg/dL — AB (ref 65–99)
POTASSIUM: 4 mmol/L (ref 3.5–5.1)
Sodium: 132 mmol/L — ABNORMAL LOW (ref 135–145)

## 2017-05-12 LAB — BASIC METABOLIC PANEL
Anion gap: 15 (ref 5–15)
BUN: 12 mg/dL (ref 6–20)
CALCIUM: 9 mg/dL (ref 8.9–10.3)
CO2: 45 mmol/L — ABNORMAL HIGH (ref 22–32)
CREATININE: 0.56 mg/dL (ref 0.44–1.00)
Chloride: 73 mmol/L — ABNORMAL LOW (ref 101–111)
Glucose, Bld: 122 mg/dL — ABNORMAL HIGH (ref 65–99)
Potassium: 3.8 mmol/L (ref 3.5–5.1)
SODIUM: 133 mmol/L — AB (ref 135–145)

## 2017-05-13 LAB — CULTURE, RESPIRATORY

## 2017-05-13 LAB — CULTURE, RESPIRATORY W GRAM STAIN

## 2017-05-16 ENCOUNTER — Other Ambulatory Visit (HOSPITAL_COMMUNITY): Payer: Self-pay

## 2017-05-16 LAB — CBC WITH DIFFERENTIAL/PLATELET
Basophils Absolute: 0 10*3/uL (ref 0.0–0.1)
Basophils Relative: 0 %
EOS ABS: 0 10*3/uL (ref 0.0–0.7)
EOS PCT: 0 %
HCT: 35.1 % — ABNORMAL LOW (ref 36.0–46.0)
Hemoglobin: 10.7 g/dL — ABNORMAL LOW (ref 12.0–15.0)
LYMPHS ABS: 2.2 10*3/uL (ref 0.7–4.0)
Lymphocytes Relative: 24 %
MCH: 27.6 pg (ref 26.0–34.0)
MCHC: 30.5 g/dL (ref 30.0–36.0)
MCV: 90.7 fL (ref 78.0–100.0)
Monocytes Absolute: 0.3 10*3/uL (ref 0.1–1.0)
Monocytes Relative: 3 %
Neutro Abs: 6.8 10*3/uL (ref 1.7–7.7)
Neutrophils Relative %: 73 %
PLATELETS: 215 10*3/uL (ref 150–400)
RBC: 3.87 MIL/uL (ref 3.87–5.11)
RDW: 16.7 % — AB (ref 11.5–15.5)
WBC: 9.4 10*3/uL (ref 4.0–10.5)

## 2017-05-16 LAB — COMPREHENSIVE METABOLIC PANEL
ALT: 20 U/L (ref 14–54)
AST: 32 U/L (ref 15–41)
Albumin: 2.5 g/dL — ABNORMAL LOW (ref 3.5–5.0)
Alkaline Phosphatase: 59 U/L (ref 38–126)
Anion gap: 18 — ABNORMAL HIGH (ref 5–15)
BUN: 17 mg/dL (ref 6–20)
CHLORIDE: 67 mmol/L — AB (ref 101–111)
CO2: 43 mmol/L — ABNORMAL HIGH (ref 22–32)
CREATININE: 0.85 mg/dL (ref 0.44–1.00)
Calcium: 8.9 mg/dL (ref 8.9–10.3)
Glucose, Bld: 63 mg/dL — ABNORMAL LOW (ref 65–99)
POTASSIUM: 4.2 mmol/L (ref 3.5–5.1)
Sodium: 128 mmol/L — ABNORMAL LOW (ref 135–145)
Total Bilirubin: 0.5 mg/dL (ref 0.3–1.2)
Total Protein: 7.9 g/dL (ref 6.5–8.1)

## 2017-05-16 LAB — BLOOD GAS, ARTERIAL
ACID-BASE EXCESS: 28.1 mmol/L — AB (ref 0.0–2.0)
Acid-Base Excess: 27.2 mmol/L — ABNORMAL HIGH (ref 0.0–2.0)
BICARBONATE: 52.9 mmol/L — AB (ref 20.0–28.0)
BICARBONATE: 53.9 mmol/L — AB (ref 20.0–28.0)
FIO2: 50
FIO2: 50
LHR: 16 {breaths}/min
LHR: 16 {breaths}/min
MECHVT: 400 mL
O2 SAT: 80.8 %
O2 Saturation: 89.2 %
PATIENT TEMPERATURE: 94.5
PATIENT TEMPERATURE: 94.6
PCO2 ART: 50.7 mmHg — AB (ref 32.0–48.0)
PCO2 ART: 52.5 mmHg — AB (ref 32.0–48.0)
PEEP/CPAP: 5 cmH2O
PEEP: 5 cmH2O
PH ART: 7.607 — AB (ref 7.350–7.450)
PO2 ART: 34.9 mmHg — AB (ref 83.0–108.0)
PO2 ART: 43.1 mmHg — AB (ref 83.0–108.0)
VT: 400 mL
pH, Arterial: 7.614 (ref 7.350–7.450)

## 2017-05-16 LAB — LACTIC ACID, PLASMA: Lactic Acid, Venous: 2.4 mmol/L (ref 0.5–1.9)

## 2017-05-19 LAB — BLOOD GAS, ARTERIAL
ACID-BASE EXCESS: 23.9 mmol/L — AB (ref 0.0–2.0)
Bicarbonate: 49.6 mmol/L — ABNORMAL HIGH (ref 20.0–28.0)
FIO2: 80
MECHVT: 400 mL
O2 SAT: 95.8 %
PEEP/CPAP: 5 cmH2O
Patient temperature: 98.6
RATE: 13 resp/min
pCO2 arterial: 64.4 mmHg — ABNORMAL HIGH (ref 32.0–48.0)
pH, Arterial: 7.498 — ABNORMAL HIGH (ref 7.350–7.450)
pO2, Arterial: 81.1 mmHg — ABNORMAL LOW (ref 83.0–108.0)

## 2017-05-19 LAB — VANCOMYCIN, TROUGH: Vancomycin Tr: 14 ug/mL — ABNORMAL LOW (ref 15–20)

## 2017-05-20 LAB — ACID FAST SMEAR (AFB, MYCOBACTERIA): Acid Fast Smear: NEGATIVE

## 2017-05-20 LAB — ACID FAST SMEAR (AFB)

## 2017-05-22 LAB — ACID FAST SMEAR (AFB): ACID FAST SMEAR - AFSCU2: POSITIVE

## 2017-05-22 LAB — ACID FAST SMEAR (AFB, MYCOBACTERIA)

## 2017-05-23 LAB — QUANTIFERON-TB GOLD PLUS (RQFGPL)
QUANTIFERON TB1 AG VALUE: 0.02 [IU]/mL
QUANTIFERON TB2 AG VALUE: 0.02 [IU]/mL
QuantiFERON Mitogen Value: 0.01 IU/mL
QuantiFERON Nil Value: 0.03 IU/mL

## 2017-05-23 LAB — QUANTIFERON-TB GOLD PLUS: QUANTIFERON-TB GOLD PLUS: UNDETERMINED

## 2017-05-24 LAB — CBC
HEMATOCRIT: 33.6 % — AB (ref 36.0–46.0)
HEMOGLOBIN: 10.2 g/dL — AB (ref 12.0–15.0)
MCH: 27.6 pg (ref 26.0–34.0)
MCHC: 30.4 g/dL (ref 30.0–36.0)
MCV: 90.8 fL (ref 78.0–100.0)
Platelets: 146 10*3/uL — ABNORMAL LOW (ref 150–400)
RBC: 3.7 MIL/uL — AB (ref 3.87–5.11)
RDW: 17.4 % — ABNORMAL HIGH (ref 11.5–15.5)
WBC: 11.9 10*3/uL — AB (ref 4.0–10.5)

## 2017-05-24 LAB — BASIC METABOLIC PANEL
ANION GAP: 16 — AB (ref 5–15)
BUN: 6 mg/dL (ref 6–20)
CO2: 43 mmol/L — AB (ref 22–32)
CREATININE: 0.89 mg/dL (ref 0.44–1.00)
Calcium: 7.8 mg/dL — ABNORMAL LOW (ref 8.9–10.3)
Chloride: 73 mmol/L — ABNORMAL LOW (ref 101–111)
GFR calc non Af Amer: 59 mL/min — ABNORMAL LOW (ref >60)
Glucose, Bld: 117 mg/dL — ABNORMAL HIGH (ref 65–99)
Potassium: 2.5 mmol/L — CL (ref 3.5–5.1)
Sodium: 132 mmol/L — ABNORMAL LOW (ref 135–145)

## 2017-05-25 LAB — BASIC METABOLIC PANEL
Anion gap: 11 (ref 5–15)
BUN: 7 mg/dL (ref 6–20)
CHLORIDE: 76 mmol/L — AB (ref 101–111)
CO2: 45 mmol/L — AB (ref 22–32)
CREATININE: 0.98 mg/dL (ref 0.44–1.00)
Calcium: 8 mg/dL — ABNORMAL LOW (ref 8.9–10.3)
GFR calc Af Amer: >60 mL/min (ref >60)
GFR calc non Af Amer: 52 mL/min — ABNORMAL LOW (ref >60)
GLUCOSE: 94 mg/dL (ref 65–99)
POTASSIUM: 3.4 mmol/L — AB (ref 3.5–5.1)
Sodium: 132 mmol/L — ABNORMAL LOW (ref 135–145)

## 2017-05-25 LAB — MAGNESIUM: Magnesium: 1.4 mg/dL — ABNORMAL LOW (ref 1.7–2.4)

## 2017-05-25 LAB — POTASSIUM: Potassium: 3.5 mmol/L (ref 3.5–5.1)

## 2017-05-26 LAB — MAGNESIUM: Magnesium: 1.8 mg/dL (ref 1.7–2.4)

## 2017-05-26 LAB — POTASSIUM: POTASSIUM: 4.7 mmol/L (ref 3.5–5.1)

## 2017-05-30 ENCOUNTER — Encounter: Payer: Self-pay | Admitting: Internal Medicine

## 2017-05-30 DIAGNOSIS — A310 Pulmonary mycobacterial infection: Secondary | ICD-10-CM | POA: Diagnosis not present

## 2017-05-30 DIAGNOSIS — J449 Chronic obstructive pulmonary disease, unspecified: Secondary | ICD-10-CM | POA: Diagnosis present

## 2017-05-30 DIAGNOSIS — J9621 Acute and chronic respiratory failure with hypoxia: Secondary | ICD-10-CM | POA: Diagnosis not present

## 2017-05-30 DIAGNOSIS — J181 Lobar pneumonia, unspecified organism: Secondary | ICD-10-CM | POA: Diagnosis present

## 2017-05-30 NOTE — Progress Notes (Signed)
Pulmonary Critical Care Medicine [facility] PULMONARY SERVICE  Date of Service: May 30, 2017  Follow Up Progress Note   Sharon Solomon  ZOX:[id number]  DOB: 1933/02/12   DOA: 05/07/2017  Referring Physician: Carron Curie, MD   HPI: Sharon Solomon is a 84 y.o. female seen for follow up of Acute on Chronic Respiratory Failure.  Patient remains on the ventilator we are still awaiting the results of the AFB cultures.  We have culture results from the previous facility which was consistent with MAI.  As noted previously I suspect that she does have MAI and will probably need to be on lifelong therapy.   Review of Systems:   ROS performed and is unremarkable other than noted above.  Past Medical History:  Diagnosis Date  . Acute on chronic respiratory failure with hypoxemia (HCC)   . COPD (chronic obstructive pulmonary disease) (HCC)   . Lobar pneumonia (HCC)   . MAI (mycobacterium avium-intracellulare) (HCC)     Past Surgical History:  Procedure Laterality Date  . TRACHEOSTOMY TUBE PLACEMENT N/A May 04, 2017   Procedure: TRACHEOSTOMY;  Surgeon: Drema Halon, MD;  Location: [facility] OR;  Service: ENT;  Laterality: N/A;    Social History:    reports that she has quit smoking. She has never used smokeless tobacco. She reports that she drank alcohol. She reports that she has current or past drug history.  Family History: Non-Contributory to the present illness  Allergies not on file  Medications:  Reviewed on Rounds  Physical Exam:  Vitals: Temperature 96.6 pulse 88 respiratory rate 37 blood pressure 136/80 saturations 95%  Ventilator Settings mode of ventilation assist control FiO2 70% tidal volume 400 PEEP 5  . General: Comfortable at this time . Eyes: Grossly normal lids, irises & conjunctiva . ENT: grossly tongue is normal . Neck: no obvious mass . Cardiovascular: S1-S2 normal no gallop . Respiratory: Diminished breath sounds scattered  rhonchi . Abdomen: Soft nontender . Skin: no rash seen on limited exam . Musculoskeletal: not rigid . Psychiatric:unable to assess . Neurologic: no seizure no involuntary movements          Labs on Admission:  Basic Metabolic Panel: Recent Labs  Lab 05/24/17 0952 05/25/17 0855 05/26/17 1501  NA 132* 132*  --   K 2.5* 3.5  3.4* 4.7  CL 73* 76*  --   CO2 43* 45*  --   GLUCOSE 117* 94  --   BUN 6 7  --   CREATININE 0.89 0.98  --   CALCIUM 7.8* 8.0*  --   MG  --  1.4* 1.8    Liver Function Tests: No results for input(s): AST, ALT, ALKPHOS, BILITOT, PROT, ALBUMIN in the last 168 hours. No results for input(s): LIPASE, AMYLASE in the last 168 hours. No results for input(s): AMMONIA in the last 168 hours.  CBC: Recent Labs  Lab 05/24/17 0952  WBC 11.9*  HGB 10.2*  HCT 33.6*  MCV 90.8  PLT 146*    Cardiac Enzymes: No results for input(s): CKTOTAL, CKMB, CKMBINDEX, TROPONINI in the last 168 hours.  BNP (last 3 results) No results for input(s): BNP in the last 8760 hours.  ProBNP (last 3 results) No results for input(s): PROBNP in the last 8760 hours.   Radiological Exams on Admission: No results found.     Assessment/Plan Active Problems:   MAI (mycobacterium avium-intracellulare) (HCC)   Acute on chronic respiratory failure with hypoxemia (HCC)   Lobar pneumonia (HCC)  COPD (chronic obstructive pulmonary disease) (HCC)   1. Acute on chronic respiratory failure with hypoxia patient remains on full vent support currently is not amenable.  We will continue to follow along 2. Lobar pneumonia has been treated with antibiotics will be continued 3. Probable MAI infection currently is on Cipro and azithromycin need to add additional therapy 4. 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 The Patient requires high complexity decision making for assessment and support.  Case was discussed on Rounds with the Respiratory Therapy Staff   Yevonne PaxSaadat A Oneita Allmon, MD [facility] Pulmonary Critical Care Medicine Sleep Medicine

## 2017-06-02 ENCOUNTER — Other Ambulatory Visit (HOSPITAL_COMMUNITY): Payer: Self-pay

## 2017-06-02 DIAGNOSIS — J449 Chronic obstructive pulmonary disease, unspecified: Secondary | ICD-10-CM

## 2017-06-02 DIAGNOSIS — J9621 Acute and chronic respiratory failure with hypoxia: Secondary | ICD-10-CM

## 2017-06-02 DIAGNOSIS — J181 Lobar pneumonia, unspecified organism: Secondary | ICD-10-CM | POA: Diagnosis not present

## 2017-06-02 DIAGNOSIS — A310 Pulmonary mycobacterial infection: Secondary | ICD-10-CM

## 2017-06-02 LAB — MAGNESIUM: Magnesium: 1.4 mg/dL — ABNORMAL LOW (ref 1.7–2.4)

## 2017-06-02 LAB — PROTIME-INR
INR: 1.18
Prothrombin Time: 14.9 seconds (ref 11.4–15.2)

## 2017-06-02 LAB — BLOOD GAS, ARTERIAL
Acid-Base Excess: 23.6 mmol/L — ABNORMAL HIGH (ref 0.0–2.0)
Acid-Base Excess: 27.1 mmol/L — ABNORMAL HIGH (ref 0.0–2.0)
Acid-Base Excess: 28.4 mmol/L — ABNORMAL HIGH (ref 0.0–2.0)
Bicarbonate: 50.6 mmol/L — ABNORMAL HIGH (ref 20.0–28.0)
Bicarbonate: 54.5 mmol/L — ABNORMAL HIGH (ref 20.0–28.0)
Bicarbonate: 55.4 mmol/L — ABNORMAL HIGH (ref 20.0–28.0)
Drawn by: 518061
FIO2: 100
FIO2: 100
FIO2: 100
LHR: 24 {breaths}/min
O2 SAT: 85.1 %
O2 SAT: 89.8 %
O2 Saturation: 74.3 %
PATIENT TEMPERATURE: 98.6
PCO2 ART: 104 mmHg — AB (ref 32.0–48.0)
PEEP/CPAP: 10 cmH2O
PEEP: 10 cmH2O
PEEP: 10 cmH2O
PH ART: 7.397 (ref 7.350–7.450)
PO2 ART: 42.7 mmHg — AB (ref 83.0–108.0)
PO2 ART: 53.8 mmHg — AB (ref 83.0–108.0)
Patient temperature: 98.6
Patient temperature: 98.6
Pressure control: 20 cmH2O
RATE: 24 resp/min
VT: 400 mL
VT: 350 mL
pCO2 arterial: 94.7 mmHg (ref 32.0–48.0)
pCO2 arterial: 92 mmHg (ref 32.0–48.0)
pH, Arterial: 7.347 — ABNORMAL LOW (ref 7.350–7.450)
pH, Arterial: 7.341 — ABNORMAL LOW (ref 7.350–7.450)
pO2, Arterial: 59.6 mmHg — ABNORMAL LOW (ref 83.0–108.0)

## 2017-06-02 LAB — ACID FAST SMEAR (AFB): ACID FAST SMEAR - AFSCU2: POSITIVE

## 2017-06-02 LAB — CK TOTAL AND CKMB (NOT AT ARMC)
CK TOTAL: 12 U/L — AB (ref 38–234)
CK, MB: 1.3 ng/mL (ref 0.5–5.0)
RELATIVE INDEX: INVALID (ref 0.0–2.5)

## 2017-06-02 LAB — COMPREHENSIVE METABOLIC PANEL
ALK PHOS: 69 U/L (ref 38–126)
ALT: 14 U/L (ref 14–54)
AST: 28 U/L (ref 15–41)
Albumin: 1.9 g/dL — ABNORMAL LOW (ref 3.5–5.0)
Anion gap: 18 — ABNORMAL HIGH (ref 5–15)
BUN: 22 mg/dL — AB (ref 6–20)
CALCIUM: 8.4 mg/dL — AB (ref 8.9–10.3)
CHLORIDE: 77 mmol/L — AB (ref 101–111)
CO2: 41 mmol/L — AB (ref 22–32)
CREATININE: 1.02 mg/dL — AB (ref 0.44–1.00)
GFR calc Af Amer: 58 mL/min — ABNORMAL LOW (ref >60)
GFR calc non Af Amer: 50 mL/min — ABNORMAL LOW (ref >60)
GLUCOSE: 99 mg/dL (ref 65–99)
Potassium: 2.5 mmol/L — CL (ref 3.5–5.1)
SODIUM: 136 mmol/L (ref 135–145)
Total Bilirubin: 0.6 mg/dL (ref 0.3–1.2)
Total Protein: 6.7 g/dL (ref 6.5–8.1)

## 2017-06-02 LAB — CBC
HCT: 26.3 % — ABNORMAL LOW (ref 36.0–46.0)
HEMOGLOBIN: 7.6 g/dL — AB (ref 12.0–15.0)
MCH: 27.9 pg (ref 26.0–34.0)
MCHC: 28.9 g/dL — ABNORMAL LOW (ref 30.0–36.0)
MCV: 96.7 fL (ref 78.0–100.0)
PLATELETS: 117 10*3/uL — AB (ref 150–400)
RBC: 2.72 MIL/uL — ABNORMAL LOW (ref 3.87–5.11)
RDW: 18.2 % — ABNORMAL HIGH (ref 11.5–15.5)
WBC: 10.9 10*3/uL — AB (ref 4.0–10.5)

## 2017-06-02 LAB — TROPONIN I: Troponin I: <0.03 ng/mL (ref <0.03)

## 2017-06-02 MED FILL — Medication: Qty: 1 | Status: AC

## 2017-06-02 NOTE — Progress Notes (Signed)
Pulmonary Critical Care Medicine [facility]   PULMONARY SERVICE  PROGRESS NOTE  Date of Service:  June 02 2017  Sharon Solomon [place]  ZOX:[id number]  DOB: 12-Jul-1932   DOA: 04/25/2017  Referring Physician: Carron Curie, MD  HPI: Sharon Solomon is a 84 y.o. female seen for follow up of Acute on Chronic Respiratory Failure.  This morning patient had a rapid response called she is currently on full support on the ventilator.  She has been having issues with hypoxia still has been requiring 100% FiO2  Medications: Reviewed on Rounds  Physical Exam:  Vitals:  Temperature 97.5 degrees pulse 72 respiratory 18 blood pressure 110/70 saturations 93%  Ventilator Settings  Mode of ventilation is assist-control and she was on 100% FiO2  . General: Comfortable at this time . Eyes: Grossly normal lids, irises & conjunctiva . ENT: grossly tongue is normal . Neck: no obvious mass . Cardiovascular:  S1-S2 normal no gallop . Respiratory:  No rhonchi expansion is equal . Abdomen:  Soft nontender . Skin: no rash seen on limited exam . Musculoskeletal: not rigid . Psychiatric:unable to assess . Neurologic: no seizure no involuntary movements         Labs on Admission:  Basic Metabolic Panel: Recent Labs  Lab 05/26/17 1501 06/02/17 0840  NA  --  136  K 4.7 2.5*  CL  --  77*  CO2  --  41*  GLUCOSE  --  99  BUN  --  22*  CREATININE  --  1.02*  CALCIUM  --  8.4*  MG 1.8 1.4*    Liver Function Tests: Recent Labs  Lab 06/02/17 0840  AST 28  ALT 14  ALKPHOS 69  BILITOT 0.6  PROT 6.7  ALBUMIN 1.9*   No results for input(s): LIPASE, AMYLASE in the last 168 hours. No results for input(s): AMMONIA in the last 168 hours.  CBC: Recent Labs  Lab 06/02/17 0840  WBC 10.9*  HGB 7.6*  HCT 26.3*  MCV 96.7  PLT 117*    Cardiac Enzymes: Recent Labs  Lab 06/02/17 0840  CKTOTAL 12*  CKMB 1.3  TROPONINI <0.03    BNP (last 3 results) No results for  input(s): BNP in the last 8760 hours.  ProBNP (last 3 results) No results for input(s): PROBNP in the last 8760 hours.  Radiological Exams on Admission: Dg Abd 1 View  Result Date: 06/02/2017 CLINICAL DATA:  Possible free intraperitoneal air on chest x-ray. EXAM: ABDOMEN - 1 VIEW COMPARISON:  Chest radiograph 06/02/2017. Abdominal radiograph 04/18/2017. FINDINGS: The lower abdomen was not imaged. An enteric tube courses into the left mid abdomen with tip not imaged. Lucency in the left abdomen extending to the left hemidiaphragm partially follows the course of the enteric tube and has a configuration compatible with gas distended stomach. No gas is seen under the right hemidiaphragm to definitely indicate pneumoperitoneum. Additional gas in the upper abdomen bilaterally appears to be within bowel loops, including mildly distended transverse colon. The lungs are more fully evaluated on the earlier chest radiograph. IMPRESSION: No convincing free air in the upper abdomen. Left upper quadrant lucency favored to reflect gaseous distension of the stomach. Additional gaseous bowel distension as well, incompletely visualized. Electronically Signed   By: Sebastian Ache M.D.   On: 06/02/2017 13:38   Dg Chest Port 1 View  Result Date: 06/02/2017 CLINICAL DATA:  COPD, tracheostomy patient, mA I infection, known large cavitary process in the right upper lobe. EXAM:  PORTABLE CHEST 1 VIEW COMPARISON:  CT scan of the chest of May 16, 2017 FINDINGS: There is a large cavity occupying 1/2 to 2/3 of the right pleural space. There is a large right pleural effusion. There is no mediastinal shift. Extensive Lee increased interstitial markings are noted throughout the left lung. The heart is not enlarged. The pulmonary vascularity is indistinct. The tracheostomy tube tip projects between the clavicular heads. The esophagogastric tube tip in proximal port project below the inferior margin of the image. There is lucency under the  hemidiaphragms which may reflect free extraluminal gas. IMPRESSION: Possible free extraluminal air under the hemidiaphragms. An abdominal series or abdominal CT scan is recommended. Large cavitary process in the right lung consistent with known MAI infection superimposed upon COPD. Extensive fibrotic changes in the left lung with possible superimposed pneumonia. No overt CHF. Thoracic aortic atherosclerosis. These results will be called to the ordering clinician or representative by the Radiologist Assistant, and communication documented in the PACS or zVision Dashboard. Electronically Signed   By: David  SwazilandJordan M.D.   On: 06/02/2017 09:21    Assessment/Plan Active Problems:   MAI (mycobacterium avium-intracellulare) (HCC)   Acute on chronic respiratory failure with hypoxemia (HCC)   Lobar pneumonia (HCC)   COPD (chronic obstructive pulmonary disease) (HCC)   1.  acute on chronic Respiratory failure with hypoxia she is ventilator dependent not able to wean we will continue with full vent support prognosis quite poor with increasing oxygen requirements.  We are also not able to increase her peep secondary to large bullous disease that she has with increased risk of having a bare trauma with pneumothorax 2. Lobar pneumonia has been on antibiotics which will be continued  3. COPD severe disease as noted on the x-rays 4. MAI the cultures from here are still pending   I have personally seen and evaluated the patient, evaluated laboratory and imaging results, formulated the assessment and plan and placed orders. The Patient requires high complexity decision making for assessment and support.  Case was discussed on Rounds with the Respiratory Therapy Staff  Yevonne PaxSaadat A Zahria Ding, MD [facility] Pulmonary Critical Care Medicine Sleep Medicine

## 2017-06-05 LAB — CULTURE, RESPIRATORY: CULTURE: NORMAL

## 2017-06-05 LAB — CULTURE, RESPIRATORY W GRAM STAIN

## 2017-06-09 DEATH — deceased

## 2017-07-16 LAB — ORG ID BY SEQUENCING RFLX AST

## 2017-07-16 LAB — AFB ORGANISM ID BY DNA PROBE
M avium complex: NEGATIVE
M tuberculosis complex: NEGATIVE

## 2017-07-16 LAB — ACID FAST CULTURE WITH REFLEXED SENSITIVITIES (MYCOBACTERIA): Acid Fast Culture: POSITIVE — AB

## 2017-07-16 LAB — ACID FAST CULTURE WITH REFLEXED SENSITIVITIES

## 2017-07-24 LAB — ACID FAST CULTURE WITH REFLEXED SENSITIVITIES (MYCOBACTERIA): Acid Fast Culture: POSITIVE — AB

## 2017-07-24 LAB — AFB ORGANISM ID BY DNA PROBE
M avium complex: POSITIVE — AB
M tuberculosis complex: NEGATIVE

## 2017-08-12 LAB — ACID FAST CULTURE WITH REFLEXED SENSITIVITIES (MYCOBACTERIA): Acid Fast Culture: POSITIVE — AB

## 2017-08-12 LAB — AFB ORGANISM ID BY DNA PROBE
M TUBERCULOSIS COMPLEX: NEGATIVE
M avium complex: POSITIVE — AB

## 2017-10-31 LAB — MAC SUSCEPTIBILITY BROTH

## 2019-08-15 IMAGING — CT CT CHEST W/O CM
2 of 3 series · 15 of 36 positions shown, 18 images · non-contrast
Comparison: Multiple chest radiographs, including 05/03/2017.
Abdominal CT 05/04/2017. No prior chest CT.

CLINICAL DATA: Pneumonia.  Tracheostomy tube 05/02/2017.

EXAM:
CT CHEST WITHOUT CONTRAST
TECHNIQUE: Multidetector CT imaging of the chest was performed following the
standard protocol without IV contrast.

[Series 4: thorax 2.0 · axial · 0.59mm/px · z∈[+1292,+1498]mm · 12 of 123 slices shown, 15 images]
[im 10/123  mediastinal]
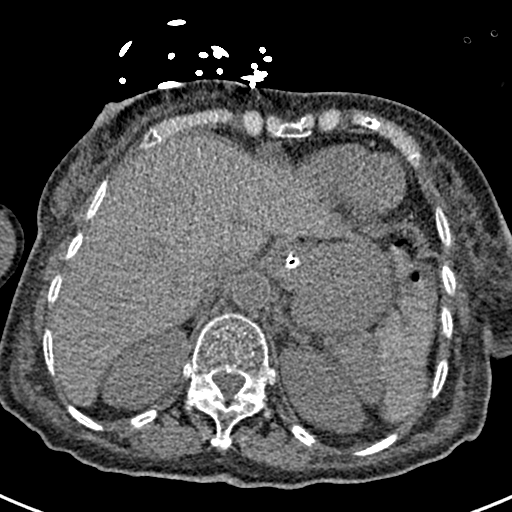
[im 10/123  lung]
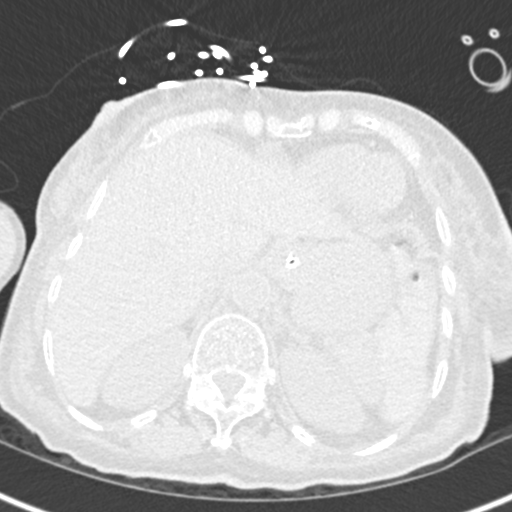
[im 19/123  lung]
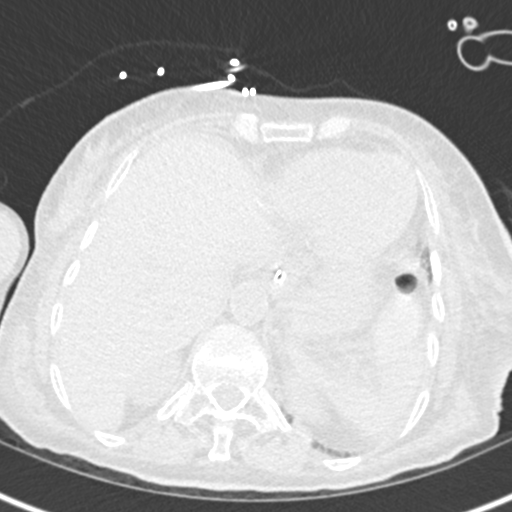
[im 28/123  lung]
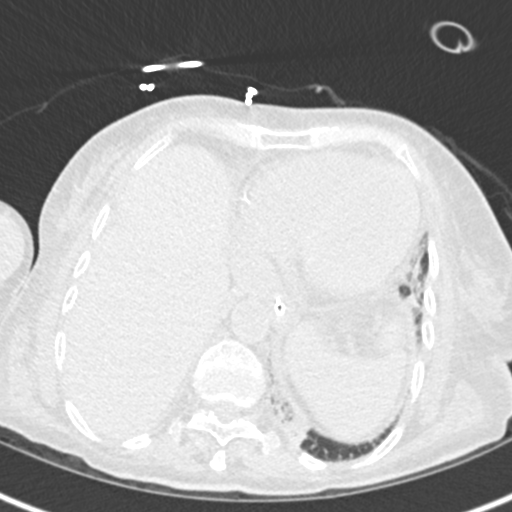
[im 37/123  lung]
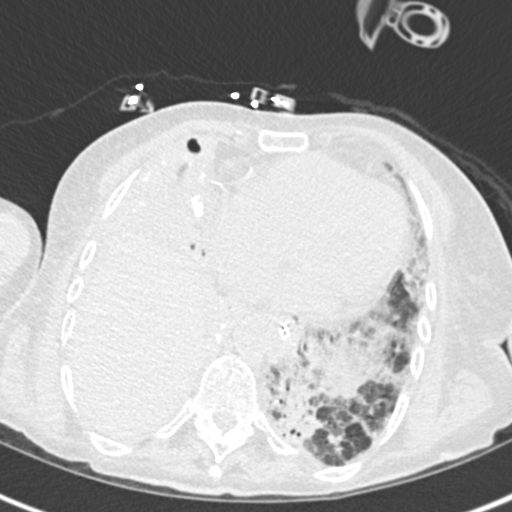
[im 46/123  mediastinal]
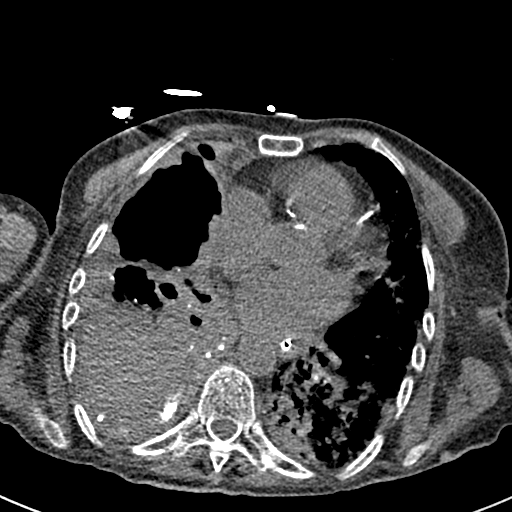
[im 46/123  lung]
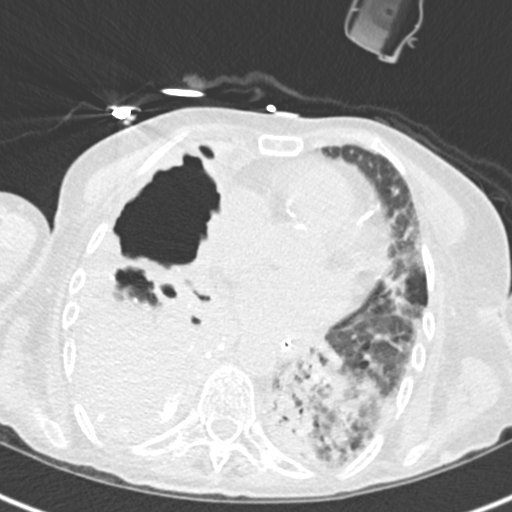
[im 55/123  lung]
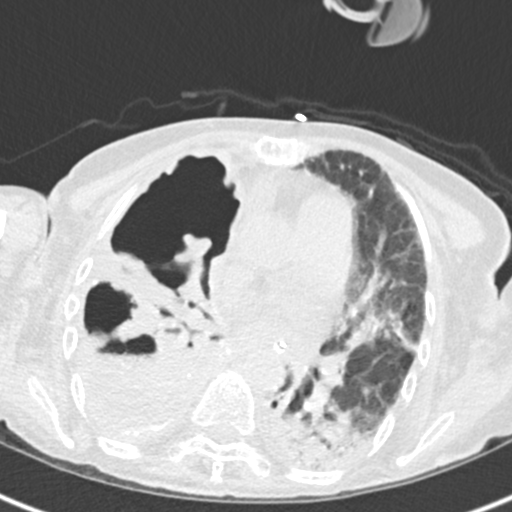
[im 68/123  lung]
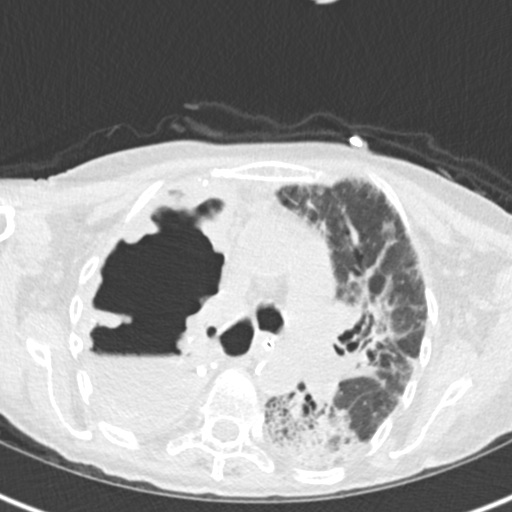
[im 77/123  lung]
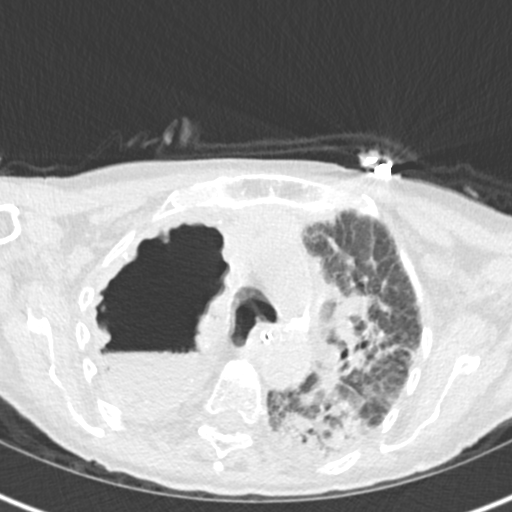
[im 86/123  mediastinal]
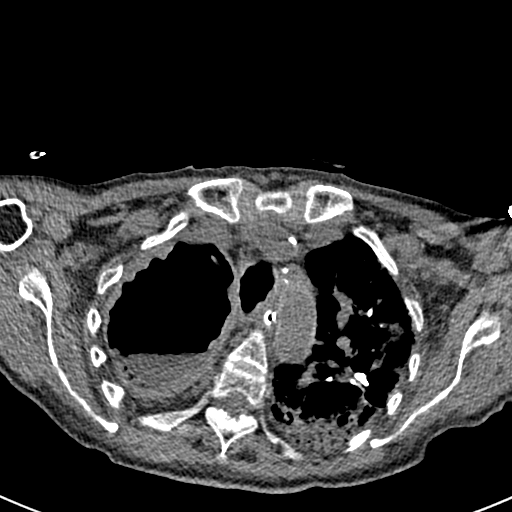
[im 86/123  lung]
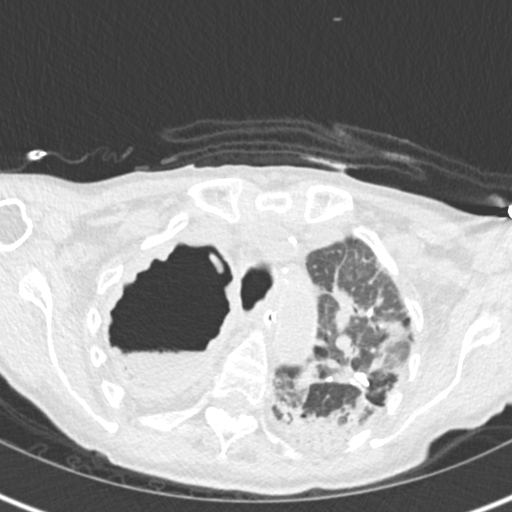
[im 95/123  lung]
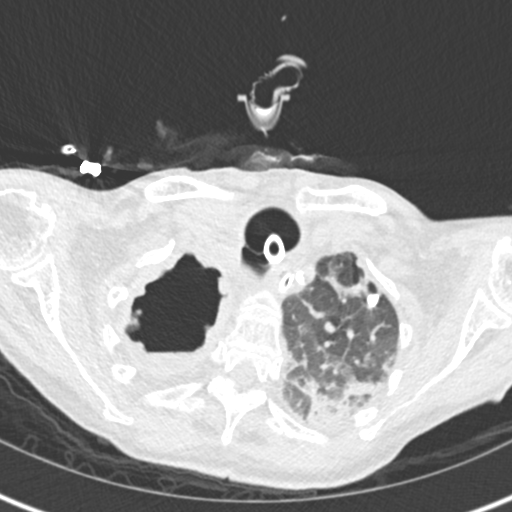
[im 104/123  lung]
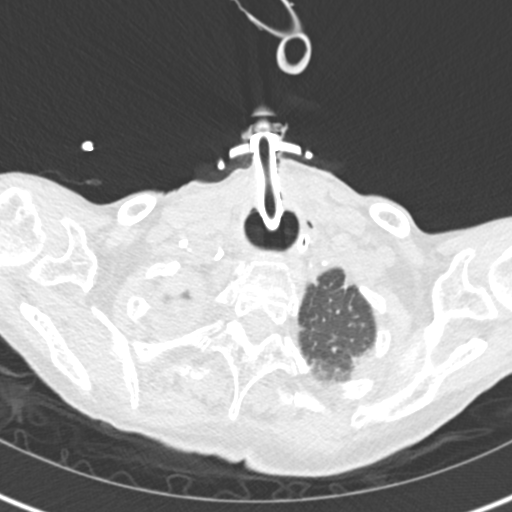
[im 113/123  lung]
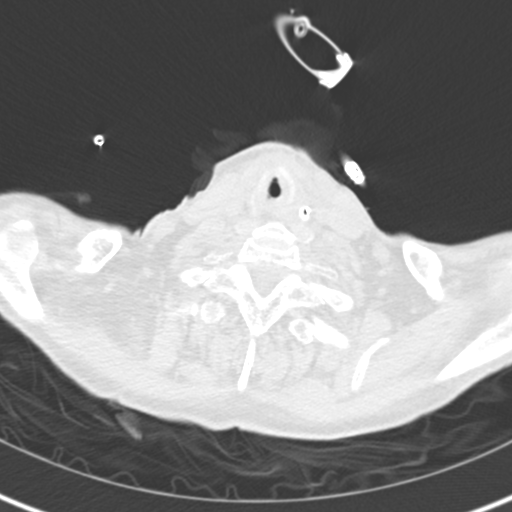

[Series 6: coronal · coronal · 0.59mm/px · 3 of 97 slices shown]
[im 20/97  lung]
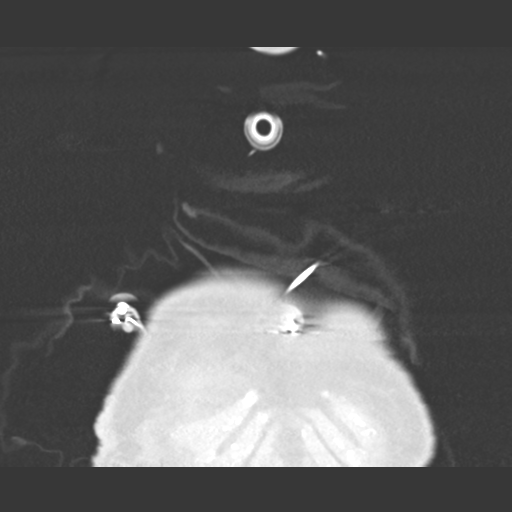
[im 39/97  lung]
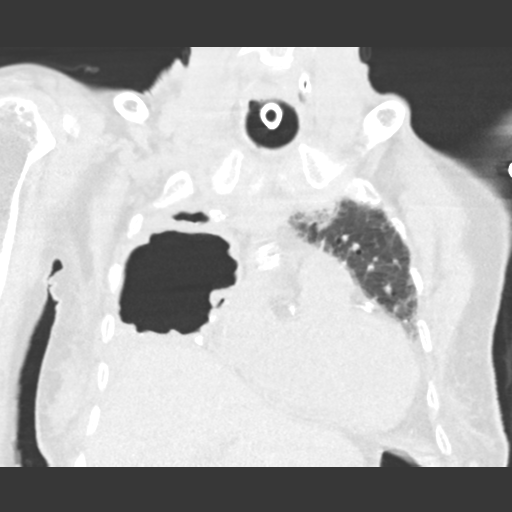
[im 58/97  lung]
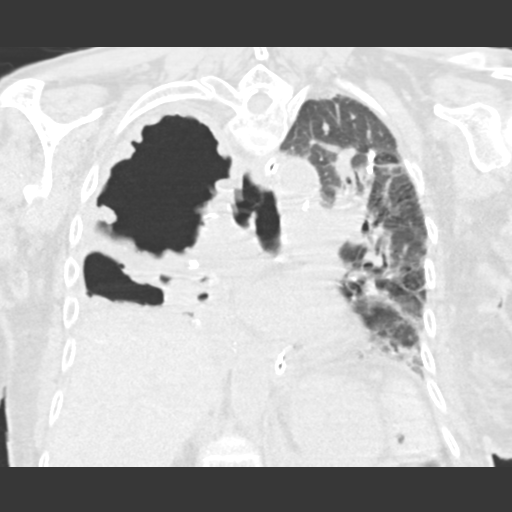

[15 of 36 positions shown; findings below may reference images not displayed]

FINDINGS: Cardiovascular: Mild motion degradation. Aortic and branch vessel
atherosclerosis. Tortuous thoracic aorta. Mild cardiomegaly.
Multivessel coronary artery atherosclerosis. Pulmonary artery
enlargement, outflow tract 3.2 cm.

Mediastinum/Nodes: Limited evaluation for mediastinal adenopathy
secondary to mediastinal edema, motion, patient arm position, and
lack of IV contrast. No gross mediastinal adenopathy. Hilar regions
poorly evaluated without intravenous contrast. Nasogastric tube
which extends beyond the inferior aspect of the study.

Lungs/Pleura: Thick walled cavitary process with fluid level
involving the majority of the right hemithorax, favored to represent
a chronic hydropneumothorax. The tracheostomy is appropriately
position. Collapse/consolidation involving the right lower and less
so right middle lobes with well-defined bronchopleural fistulae,
including arising from the right middle lobe anteriorly on image
69/8 and right lower lobe laterally on image 74/8. Calcification
within the right lower lobe consolidation.

Patchy multifocal airspace disease involving the left lung, most
significant in the left lower lobe. Areas of left lower lobe mild
bronchiectasis.

Partially calcified left upper lobe pulmonary nodule of 1.5 cm is
likely a granuloma.

Upper Abdomen: Multifactorial degradation continuing into the upper
abdomen. Grossly normal imaged portions of the liver, spleen,
adrenal glands, kidneys.

Musculoskeletal: Osteopenia. Accentuation of expected thoracic
kyphosis. Loss of midthoracic vertebral body height, especially at
T8.
IMPRESSION: 1. Decreased sensitivity and specificity exam due to technique
related factors, as described above.
2. Multifocal left-sided pneumonia.
3. Cavitary process throughout the right hemithorax, favored to
represent chronic hydropneumothorax secondary to multiple
bronchopleural fistulae. Underlying right lower lobe
collapse/consolidation, favored to represent infection. A neoplastic
process within the right pleural space cannot be excluded, given
wall thickening.
4. Coronary artery atherosclerosis. Aortic Atherosclerosis
([id number]).
5. Pulmonary artery enlargement suggests pulmonary arterial
hypertension.
